# Patient Record
Sex: Male | Born: 1985
Health system: Southern US, Community
[De-identification: ages and names within clinical notes are randomized; demographics above are authoritative.]

---

## 2013-02-26 ENCOUNTER — Encounter (HOSPITAL_COMMUNITY): Payer: Self-pay | Admitting: Emergency Medicine

## 2013-02-26 ENCOUNTER — Emergency Department (INDEPENDENT_AMBULATORY_CARE_PROVIDER_SITE_OTHER)
Admission: EM | Admit: 2013-02-26 | Discharge: 2013-02-26 | Disposition: A | Discharge status: Home / Self Care | Payer: BC Managed Care – PPO | Source: Home / Self Care | Attending: Family Medicine | Admitting: Family Medicine

## 2013-02-26 DIAGNOSIS — S139XXA Sprain of joints and ligaments of unspecified parts of neck, initial encounter: Secondary | ICD-10-CM

## 2013-02-26 DIAGNOSIS — S134XXA Sprain of ligaments of cervical spine, initial encounter: Secondary | ICD-10-CM

## 2013-02-26 MED ORDER — CYCLOBENZAPRINE HCL 10 MG PO TABS: 10.0000 mg | ORAL_TABLET | Freq: Two times a day (BID) | ORAL | Status: AC | PRN

## 2013-02-26 NOTE — ED Provider Notes (Signed)
Medical screening examination/treatment/procedure(s) were performed by resident physician or non-physician practitioner and as supervising physician I was immediately available for consultation/collaboration.   Barkley Bruns MD.   Linna Hoff, MD 02/26/13 [phone]
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
## 2016-01-03 DIAGNOSIS — M542 Cervicalgia: Secondary | ICD-10-CM | POA: Diagnosis not present

## 2016-01-11 DIAGNOSIS — M542 Cervicalgia: Secondary | ICD-10-CM | POA: Diagnosis not present

## 2016-01-22 DIAGNOSIS — M542 Cervicalgia: Secondary | ICD-10-CM | POA: Diagnosis not present

## 2016-01-22 DIAGNOSIS — M50022 Cervical disc disorder at C5-C6 level with myelopathy: Secondary | ICD-10-CM | POA: Diagnosis not present

## 2016-01-22 DIAGNOSIS — M50322 Other cervical disc degeneration at C5-C6 level: Secondary | ICD-10-CM | POA: Diagnosis not present

## 2016-01-30 DIAGNOSIS — M50022 Cervical disc disorder at C5-C6 level with myelopathy: Secondary | ICD-10-CM | POA: Diagnosis not present

## 2016-02-06 DIAGNOSIS — M50022 Cervical disc disorder at C5-C6 level with myelopathy: Secondary | ICD-10-CM | POA: Diagnosis not present

## 2016-02-08 DIAGNOSIS — M50022 Cervical disc disorder at C5-C6 level with myelopathy: Secondary | ICD-10-CM | POA: Diagnosis not present

## 2016-02-13 DIAGNOSIS — M50022 Cervical disc disorder at C5-C6 level with myelopathy: Secondary | ICD-10-CM | POA: Diagnosis not present

## 2016-02-27 DIAGNOSIS — M50322 Other cervical disc degeneration at C5-C6 level: Secondary | ICD-10-CM | POA: Diagnosis not present

## 2016-02-27 DIAGNOSIS — M50022 Cervical disc disorder at C5-C6 level with myelopathy: Secondary | ICD-10-CM | POA: Diagnosis not present

## 2016-03-24 DIAGNOSIS — R05 Cough: Secondary | ICD-10-CM | POA: Diagnosis not present

## 2016-04-11 DIAGNOSIS — M542 Cervicalgia: Secondary | ICD-10-CM | POA: Diagnosis not present

## 2016-04-29 DIAGNOSIS — J069 Acute upper respiratory infection, unspecified: Secondary | ICD-10-CM | POA: Diagnosis not present

## 2016-04-29 DIAGNOSIS — R05 Cough: Secondary | ICD-10-CM | POA: Diagnosis not present

## 2016-05-09 DIAGNOSIS — M542 Cervicalgia: Secondary | ICD-10-CM | POA: Diagnosis not present

## 2016-06-09 DIAGNOSIS — M542 Cervicalgia: Secondary | ICD-10-CM | POA: Diagnosis not present

## 2016-07-09 DIAGNOSIS — M542 Cervicalgia: Secondary | ICD-10-CM | POA: Diagnosis not present

## 2016-08-09 DIAGNOSIS — M542 Cervicalgia: Secondary | ICD-10-CM | POA: Diagnosis not present

## 2016-09-12 DIAGNOSIS — Z131 Encounter for screening for diabetes mellitus: Secondary | ICD-10-CM | POA: Diagnosis not present

## 2016-09-12 DIAGNOSIS — Z13220 Encounter for screening for lipoid disorders: Secondary | ICD-10-CM | POA: Diagnosis not present

## 2016-09-12 DIAGNOSIS — Z8379 Family history of other diseases of the digestive system: Secondary | ICD-10-CM | POA: Diagnosis not present

## 2016-09-12 DIAGNOSIS — Z8349 Family history of other endocrine, nutritional and metabolic diseases: Secondary | ICD-10-CM | POA: Diagnosis not present

## 2016-09-12 DIAGNOSIS — Z0000 Encounter for general adult medical examination without abnormal findings: Secondary | ICD-10-CM | POA: Diagnosis not present

## 2016-10-29 DIAGNOSIS — R7989 Other specified abnormal findings of blood chemistry: Secondary | ICD-10-CM | POA: Diagnosis not present

## 2016-10-29 DIAGNOSIS — R946 Abnormal results of thyroid function studies: Secondary | ICD-10-CM | POA: Diagnosis not present

## 2016-12-24 DIAGNOSIS — B349 Viral infection, unspecified: Secondary | ICD-10-CM | POA: Diagnosis not present

## 2016-12-24 DIAGNOSIS — L237 Allergic contact dermatitis due to plants, except food: Secondary | ICD-10-CM | POA: Diagnosis not present

## 2016-12-24 DIAGNOSIS — E039 Hypothyroidism, unspecified: Secondary | ICD-10-CM | POA: Diagnosis not present

## 2017-04-08 DIAGNOSIS — B349 Viral infection, unspecified: Secondary | ICD-10-CM | POA: Diagnosis not present

## 2017-04-08 DIAGNOSIS — R509 Fever, unspecified: Secondary | ICD-10-CM | POA: Diagnosis not present

## 2017-04-08 DIAGNOSIS — A084 Viral intestinal infection, unspecified: Secondary | ICD-10-CM | POA: Diagnosis not present

## 2017-05-28 DIAGNOSIS — J209 Acute bronchitis, unspecified: Secondary | ICD-10-CM | POA: Diagnosis not present

## 2017-07-09 DIAGNOSIS — F4323 Adjustment disorder with mixed anxiety and depressed mood: Secondary | ICD-10-CM | POA: Diagnosis not present

## 2017-07-16 DIAGNOSIS — F4323 Adjustment disorder with mixed anxiety and depressed mood: Secondary | ICD-10-CM | POA: Diagnosis not present

## 2018-04-21 DIAGNOSIS — R509 Fever, unspecified: Secondary | ICD-10-CM | POA: Diagnosis not present

## 2018-04-21 DIAGNOSIS — R69 Illness, unspecified: Secondary | ICD-10-CM | POA: Diagnosis not present

## 2018-04-21 DIAGNOSIS — R52 Pain, unspecified: Secondary | ICD-10-CM | POA: Diagnosis not present

## 2018-10-07 DIAGNOSIS — Z20828 Contact with and (suspected) exposure to other viral communicable diseases: Secondary | ICD-10-CM | POA: Diagnosis not present

## 2019-04-05 DIAGNOSIS — R0789 Other chest pain: Secondary | ICD-10-CM | POA: Diagnosis not present

## 2019-04-19 DIAGNOSIS — R0789 Other chest pain: Secondary | ICD-10-CM | POA: Diagnosis not present

## 2019-04-26 DIAGNOSIS — R0789 Other chest pain: Secondary | ICD-10-CM | POA: Diagnosis not present

## 2019-08-23 DIAGNOSIS — R519 Headache, unspecified: Secondary | ICD-10-CM | POA: Diagnosis not present

## 2019-08-23 DIAGNOSIS — J309 Allergic rhinitis, unspecified: Secondary | ICD-10-CM | POA: Diagnosis not present

## 2019-08-23 DIAGNOSIS — R0981 Nasal congestion: Secondary | ICD-10-CM | POA: Diagnosis not present

## 2019-08-25 ENCOUNTER — Other Ambulatory Visit: Payer: Self-pay | Admitting: Unknown Physician Specialty

## 2019-08-25 ENCOUNTER — Other Ambulatory Visit (HOSPITAL_COMMUNITY): Payer: Self-pay | Admitting: Unknown Physician Specialty

## 2019-08-25 DIAGNOSIS — R519 Headache, unspecified: Secondary | ICD-10-CM

## 2019-08-27 DIAGNOSIS — R519 Headache, unspecified: Secondary | ICD-10-CM | POA: Diagnosis not present

## 2019-08-27 DIAGNOSIS — M542 Cervicalgia: Secondary | ICD-10-CM | POA: Diagnosis not present

## 2019-08-27 DIAGNOSIS — M9901 Segmental and somatic dysfunction of cervical region: Secondary | ICD-10-CM | POA: Diagnosis not present

## 2019-08-27 DIAGNOSIS — M5412 Radiculopathy, cervical region: Secondary | ICD-10-CM | POA: Diagnosis not present

## 2019-08-30 DIAGNOSIS — M9901 Segmental and somatic dysfunction of cervical region: Secondary | ICD-10-CM | POA: Diagnosis not present

## 2019-08-30 DIAGNOSIS — M5412 Radiculopathy, cervical region: Secondary | ICD-10-CM | POA: Diagnosis not present

## 2019-08-30 DIAGNOSIS — R519 Headache, unspecified: Secondary | ICD-10-CM | POA: Diagnosis not present

## 2019-08-30 DIAGNOSIS — M542 Cervicalgia: Secondary | ICD-10-CM | POA: Diagnosis not present

## 2019-09-03 DIAGNOSIS — R519 Headache, unspecified: Secondary | ICD-10-CM | POA: Diagnosis not present

## 2019-09-03 DIAGNOSIS — M9901 Segmental and somatic dysfunction of cervical region: Secondary | ICD-10-CM | POA: Diagnosis not present

## 2019-09-03 DIAGNOSIS — M5412 Radiculopathy, cervical region: Secondary | ICD-10-CM | POA: Diagnosis not present

## 2019-09-03 DIAGNOSIS — M542 Cervicalgia: Secondary | ICD-10-CM | POA: Diagnosis not present

## 2019-09-07 ENCOUNTER — Ambulatory Visit
Admission: RE | Admit: 2019-09-07 | Discharge: 2019-09-07 | Disposition: A | Discharge status: Home / Self Care | Payer: BLUE CROSS/BLUE SHIELD | Source: Ambulatory Visit | Attending: Unknown Physician Specialty | Admitting: Unknown Physician Specialty

## 2019-09-07 ENCOUNTER — Other Ambulatory Visit: Payer: Self-pay

## 2019-09-07 DIAGNOSIS — M9901 Segmental and somatic dysfunction of cervical region: Secondary | ICD-10-CM | POA: Diagnosis not present

## 2019-09-07 DIAGNOSIS — R519 Headache, unspecified: Secondary | ICD-10-CM | POA: Insufficient documentation

## 2019-09-07 DIAGNOSIS — M542 Cervicalgia: Secondary | ICD-10-CM | POA: Diagnosis not present

## 2019-09-07 DIAGNOSIS — M5412 Radiculopathy, cervical region: Secondary | ICD-10-CM | POA: Diagnosis not present

## 2019-09-07 DIAGNOSIS — J3489 Other specified disorders of nose and nasal sinuses: Secondary | ICD-10-CM | POA: Diagnosis not present

## 2019-09-07 DIAGNOSIS — J341 Cyst and mucocele of nose and nasal sinus: Secondary | ICD-10-CM | POA: Diagnosis not present

## 2019-09-07 MED ORDER — GADOBUTROL 1 MMOL/ML IV SOLN
9.0000 mL | Freq: Once | INTRAVENOUS | Status: AC | PRN
  Administered 2019-09-07: 11:00:00 9 mL via INTRAVENOUS

## 2019-09-10 DIAGNOSIS — M5412 Radiculopathy, cervical region: Secondary | ICD-10-CM | POA: Diagnosis not present

## 2019-09-10 DIAGNOSIS — R519 Headache, unspecified: Secondary | ICD-10-CM | POA: Diagnosis not present

## 2019-09-10 DIAGNOSIS — M9901 Segmental and somatic dysfunction of cervical region: Secondary | ICD-10-CM | POA: Diagnosis not present

## 2019-09-10 DIAGNOSIS — M542 Cervicalgia: Secondary | ICD-10-CM | POA: Diagnosis not present

## 2019-09-14 DIAGNOSIS — M9901 Segmental and somatic dysfunction of cervical region: Secondary | ICD-10-CM | POA: Diagnosis not present

## 2019-09-14 DIAGNOSIS — M5412 Radiculopathy, cervical region: Secondary | ICD-10-CM | POA: Diagnosis not present

## 2019-09-14 DIAGNOSIS — R519 Headache, unspecified: Secondary | ICD-10-CM | POA: Diagnosis not present

## 2019-09-14 DIAGNOSIS — M542 Cervicalgia: Secondary | ICD-10-CM | POA: Diagnosis not present

## 2019-09-17 DIAGNOSIS — M542 Cervicalgia: Secondary | ICD-10-CM | POA: Diagnosis not present

## 2019-09-17 DIAGNOSIS — R519 Headache, unspecified: Secondary | ICD-10-CM | POA: Diagnosis not present

## 2019-09-17 DIAGNOSIS — M9901 Segmental and somatic dysfunction of cervical region: Secondary | ICD-10-CM | POA: Diagnosis not present

## 2019-09-17 DIAGNOSIS — M5412 Radiculopathy, cervical region: Secondary | ICD-10-CM | POA: Diagnosis not present

## 2019-09-28 DIAGNOSIS — M5412 Radiculopathy, cervical region: Secondary | ICD-10-CM | POA: Diagnosis not present

## 2019-09-28 DIAGNOSIS — M542 Cervicalgia: Secondary | ICD-10-CM | POA: Diagnosis not present

## 2019-09-28 DIAGNOSIS — J301 Allergic rhinitis due to pollen: Secondary | ICD-10-CM | POA: Diagnosis not present

## 2019-09-28 DIAGNOSIS — R519 Headache, unspecified: Secondary | ICD-10-CM | POA: Diagnosis not present

## 2019-09-28 DIAGNOSIS — M9901 Segmental and somatic dysfunction of cervical region: Secondary | ICD-10-CM | POA: Diagnosis not present

## 2019-10-01 DIAGNOSIS — R519 Headache, unspecified: Secondary | ICD-10-CM | POA: Diagnosis not present

## 2019-10-01 DIAGNOSIS — M5412 Radiculopathy, cervical region: Secondary | ICD-10-CM | POA: Diagnosis not present

## 2019-10-01 DIAGNOSIS — M9901 Segmental and somatic dysfunction of cervical region: Secondary | ICD-10-CM | POA: Diagnosis not present

## 2019-10-01 DIAGNOSIS — M542 Cervicalgia: Secondary | ICD-10-CM | POA: Diagnosis not present

## 2019-10-05 DIAGNOSIS — M542 Cervicalgia: Secondary | ICD-10-CM | POA: Diagnosis not present

## 2019-10-05 DIAGNOSIS — M5412 Radiculopathy, cervical region: Secondary | ICD-10-CM | POA: Diagnosis not present

## 2019-10-05 DIAGNOSIS — M9901 Segmental and somatic dysfunction of cervical region: Secondary | ICD-10-CM | POA: Diagnosis not present

## 2019-10-05 DIAGNOSIS — R519 Headache, unspecified: Secondary | ICD-10-CM | POA: Diagnosis not present

## 2019-10-06 DIAGNOSIS — J301 Allergic rhinitis due to pollen: Secondary | ICD-10-CM | POA: Diagnosis not present

## 2019-10-08 DIAGNOSIS — M5412 Radiculopathy, cervical region: Secondary | ICD-10-CM | POA: Diagnosis not present

## 2019-10-08 DIAGNOSIS — M542 Cervicalgia: Secondary | ICD-10-CM | POA: Diagnosis not present

## 2019-10-08 DIAGNOSIS — R519 Headache, unspecified: Secondary | ICD-10-CM | POA: Diagnosis not present

## 2019-10-08 DIAGNOSIS — M9901 Segmental and somatic dysfunction of cervical region: Secondary | ICD-10-CM | POA: Diagnosis not present

## 2019-10-11 DIAGNOSIS — J301 Allergic rhinitis due to pollen: Secondary | ICD-10-CM | POA: Diagnosis not present

## 2019-10-12 DIAGNOSIS — M542 Cervicalgia: Secondary | ICD-10-CM | POA: Diagnosis not present

## 2019-10-12 DIAGNOSIS — M9901 Segmental and somatic dysfunction of cervical region: Secondary | ICD-10-CM | POA: Diagnosis not present

## 2019-10-12 DIAGNOSIS — M5412 Radiculopathy, cervical region: Secondary | ICD-10-CM | POA: Diagnosis not present

## 2019-10-12 DIAGNOSIS — R519 Headache, unspecified: Secondary | ICD-10-CM | POA: Diagnosis not present

## 2019-10-14 DIAGNOSIS — J301 Allergic rhinitis due to pollen: Secondary | ICD-10-CM | POA: Diagnosis not present

## 2019-10-18 DIAGNOSIS — J301 Allergic rhinitis due to pollen: Secondary | ICD-10-CM | POA: Diagnosis not present

## 2019-10-19 DIAGNOSIS — M542 Cervicalgia: Secondary | ICD-10-CM | POA: Diagnosis not present

## 2019-10-19 DIAGNOSIS — R519 Headache, unspecified: Secondary | ICD-10-CM | POA: Diagnosis not present

## 2019-10-19 DIAGNOSIS — J301 Allergic rhinitis due to pollen: Secondary | ICD-10-CM | POA: Diagnosis not present

## 2019-10-19 DIAGNOSIS — M5412 Radiculopathy, cervical region: Secondary | ICD-10-CM | POA: Diagnosis not present

## 2019-10-19 DIAGNOSIS — M9901 Segmental and somatic dysfunction of cervical region: Secondary | ICD-10-CM | POA: Diagnosis not present

## 2019-10-20 DIAGNOSIS — J309 Allergic rhinitis, unspecified: Secondary | ICD-10-CM | POA: Diagnosis not present

## 2019-10-21 DIAGNOSIS — J301 Allergic rhinitis due to pollen: Secondary | ICD-10-CM | POA: Diagnosis not present

## 2019-10-22 DIAGNOSIS — M5412 Radiculopathy, cervical region: Secondary | ICD-10-CM | POA: Diagnosis not present

## 2019-10-22 DIAGNOSIS — M542 Cervicalgia: Secondary | ICD-10-CM | POA: Diagnosis not present

## 2019-10-22 DIAGNOSIS — M9901 Segmental and somatic dysfunction of cervical region: Secondary | ICD-10-CM | POA: Diagnosis not present

## 2019-10-22 DIAGNOSIS — R519 Headache, unspecified: Secondary | ICD-10-CM | POA: Diagnosis not present

## 2019-10-25 DIAGNOSIS — J301 Allergic rhinitis due to pollen: Secondary | ICD-10-CM | POA: Diagnosis not present

## 2019-10-26 DIAGNOSIS — M542 Cervicalgia: Secondary | ICD-10-CM | POA: Diagnosis not present

## 2019-10-26 DIAGNOSIS — M9901 Segmental and somatic dysfunction of cervical region: Secondary | ICD-10-CM | POA: Diagnosis not present

## 2019-10-26 DIAGNOSIS — R519 Headache, unspecified: Secondary | ICD-10-CM | POA: Diagnosis not present

## 2019-10-26 DIAGNOSIS — M5412 Radiculopathy, cervical region: Secondary | ICD-10-CM | POA: Diagnosis not present

## 2019-10-28 DIAGNOSIS — J301 Allergic rhinitis due to pollen: Secondary | ICD-10-CM | POA: Diagnosis not present

## 2019-10-29 DIAGNOSIS — M5412 Radiculopathy, cervical region: Secondary | ICD-10-CM | POA: Diagnosis not present

## 2019-10-29 DIAGNOSIS — M9901 Segmental and somatic dysfunction of cervical region: Secondary | ICD-10-CM | POA: Diagnosis not present

## 2019-10-29 DIAGNOSIS — M542 Cervicalgia: Secondary | ICD-10-CM | POA: Diagnosis not present

## 2019-10-29 DIAGNOSIS — R519 Headache, unspecified: Secondary | ICD-10-CM | POA: Diagnosis not present

## 2019-11-01 DIAGNOSIS — J301 Allergic rhinitis due to pollen: Secondary | ICD-10-CM | POA: Diagnosis not present

## 2019-11-02 DIAGNOSIS — M9901 Segmental and somatic dysfunction of cervical region: Secondary | ICD-10-CM | POA: Diagnosis not present

## 2019-11-02 DIAGNOSIS — M542 Cervicalgia: Secondary | ICD-10-CM | POA: Diagnosis not present

## 2019-11-02 DIAGNOSIS — R519 Headache, unspecified: Secondary | ICD-10-CM | POA: Diagnosis not present

## 2019-11-02 DIAGNOSIS — M5412 Radiculopathy, cervical region: Secondary | ICD-10-CM | POA: Diagnosis not present

## 2019-11-04 DIAGNOSIS — J301 Allergic rhinitis due to pollen: Secondary | ICD-10-CM | POA: Diagnosis not present

## 2019-11-08 DIAGNOSIS — J301 Allergic rhinitis due to pollen: Secondary | ICD-10-CM | POA: Diagnosis not present

## 2019-11-11 DIAGNOSIS — J301 Allergic rhinitis due to pollen: Secondary | ICD-10-CM | POA: Diagnosis not present

## 2019-11-12 DIAGNOSIS — J301 Allergic rhinitis due to pollen: Secondary | ICD-10-CM | POA: Diagnosis not present

## 2019-11-18 DIAGNOSIS — J301 Allergic rhinitis due to pollen: Secondary | ICD-10-CM | POA: Diagnosis not present

## 2019-11-25 DIAGNOSIS — J301 Allergic rhinitis due to pollen: Secondary | ICD-10-CM | POA: Diagnosis not present

## 2019-12-02 DIAGNOSIS — J301 Allergic rhinitis due to pollen: Secondary | ICD-10-CM | POA: Diagnosis not present

## 2019-12-06 DIAGNOSIS — J301 Allergic rhinitis due to pollen: Secondary | ICD-10-CM | POA: Diagnosis not present

## 2019-12-09 DIAGNOSIS — J301 Allergic rhinitis due to pollen: Secondary | ICD-10-CM | POA: Diagnosis not present

## 2019-12-13 DIAGNOSIS — J301 Allergic rhinitis due to pollen: Secondary | ICD-10-CM | POA: Diagnosis not present

## 2019-12-21 DIAGNOSIS — J301 Allergic rhinitis due to pollen: Secondary | ICD-10-CM | POA: Diagnosis not present

## 2020-01-21 DIAGNOSIS — J301 Allergic rhinitis due to pollen: Secondary | ICD-10-CM | POA: Diagnosis not present

## 2020-01-24 DIAGNOSIS — J301 Allergic rhinitis due to pollen: Secondary | ICD-10-CM | POA: Diagnosis not present

## 2020-04-19 DIAGNOSIS — J309 Allergic rhinitis, unspecified: Secondary | ICD-10-CM | POA: Diagnosis not present

## 2020-04-19 DIAGNOSIS — R0982 Postnasal drip: Secondary | ICD-10-CM | POA: Diagnosis not present

## 2020-04-19 DIAGNOSIS — J3489 Other specified disorders of nose and nasal sinuses: Secondary | ICD-10-CM | POA: Diagnosis not present

## 2020-04-26 DIAGNOSIS — R0789 Other chest pain: Secondary | ICD-10-CM | POA: Diagnosis not present

## 2020-04-26 DIAGNOSIS — Z23 Encounter for immunization: Secondary | ICD-10-CM | POA: Diagnosis not present

## 2020-05-19 DIAGNOSIS — J301 Allergic rhinitis due to pollen: Secondary | ICD-10-CM | POA: Diagnosis not present

## 2020-05-22 DIAGNOSIS — J301 Allergic rhinitis due to pollen: Secondary | ICD-10-CM | POA: Diagnosis not present

## 2020-07-22 IMAGING — MR MR HEAD WO/W CM
14 series · 48 of 48 positions shown · IV contrast (gadavist)
Comparison: No pertinent prior studies available for comparison.

CLINICAL DATA: Non-intractable headache, unspecified chronicity
pattern, unspecified headache type. Additional history provided by
technologist: Patient reports chronic headaches since the age of
[DATE], states increased severity of headaches since COVID vaccine
earlier this year.

EXAM:
MRI HEAD WITHOUT AND WITH CONTRAST
TECHNIQUE: Multiplanar, multiecho pulse sequences of the brain and surrounding
structures were obtained without and with intravenous contrast.
CONTRAST:  9mL GADAVIST GADOBUTROL 1 MMOL/ML IV SOLN

[Series 5: ax dwi_tracew · axial · 3.0mm · 0.60mm/px · z∈[-79,+82]mm · 2 of 50 slices shown]
[im 1/50]
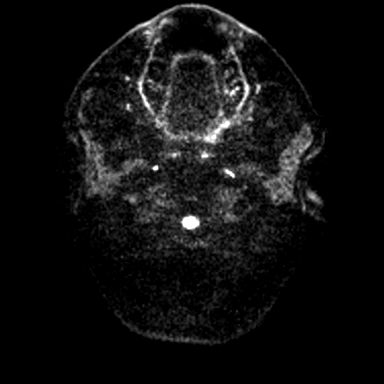
[im 50/50]
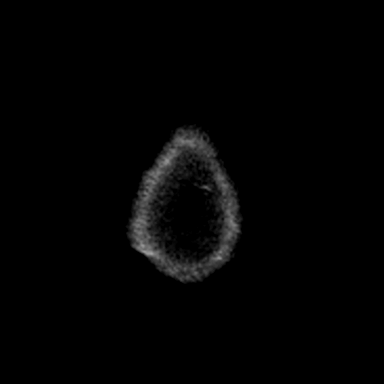

[Series 6: ax dwi_adc · axial · 3.0mm · 0.60mm/px · z∈[-79,+82]mm · 3 of 50 slices shown]
[im 1/50]
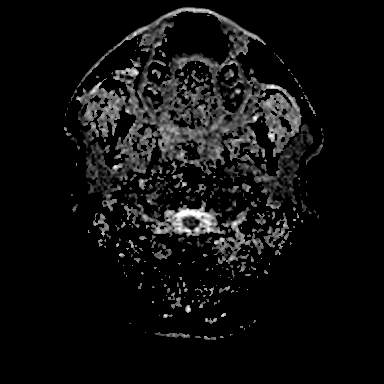
[im 25/50]
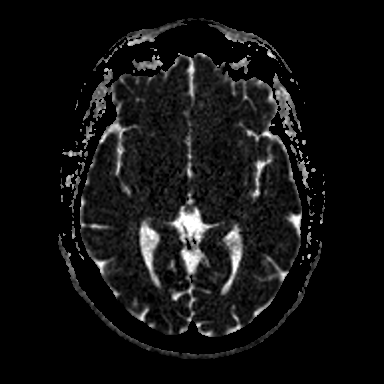
[im 50/50]
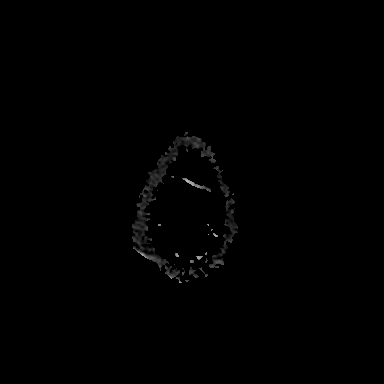

[Series 7: cor dwi_tracew · coronal · 5.0mm · 0.60mm/px · 2 of 38 slices shown]
[im 1/38]
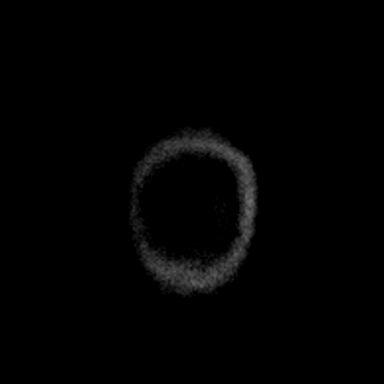
[im 38/38]
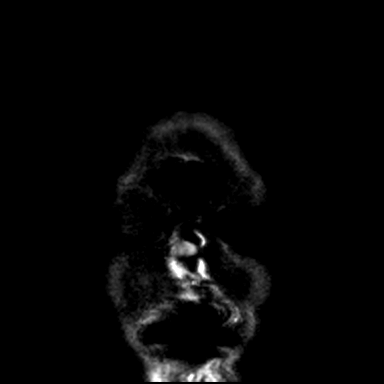

[Series 8: cor dwi_adc · coronal · 5.0mm · 0.60mm/px · 2 of 38 slices shown]
[im 1/38]
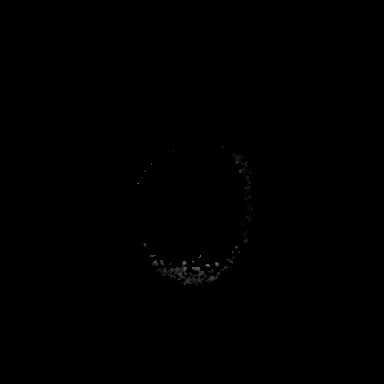
[im 38/38]
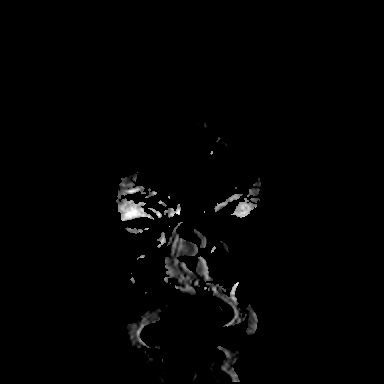

[Series 9: T1 · sagittal · 5.0mm · 0.62mm/px · 1 of 25 slices shown (1 of 2)]
[im 1/25]
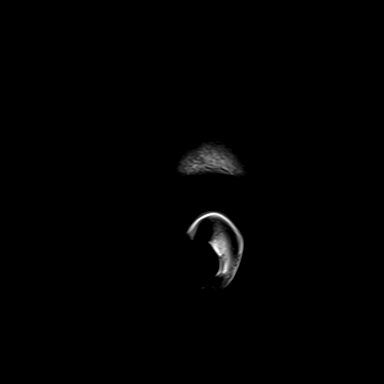

[Series 10: T2 · axial · 5.0mm · 0.53mm/px · z∈[-82,+86]mm · 2 of 29 slices shown]
[im 1/29]
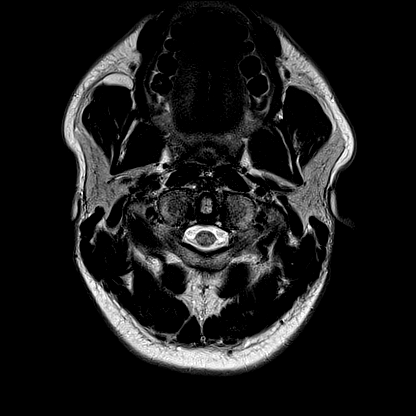
[im 29/29]
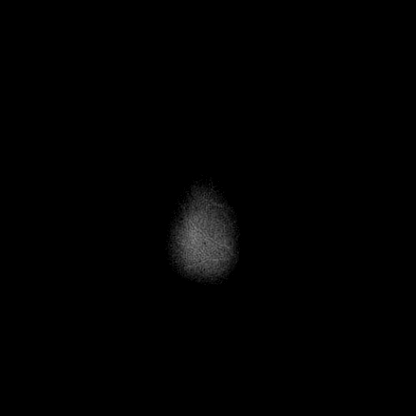

[Series 11: mag_images · axial · 3.0mm · 0.90mm/px · z∈[-86,+91]mm · 3 of 60 slices shown]
[im 1/60]
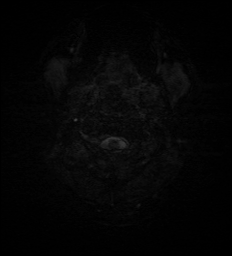
[im 30/60]
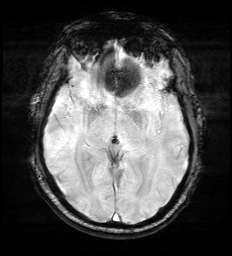
[im 60/60]
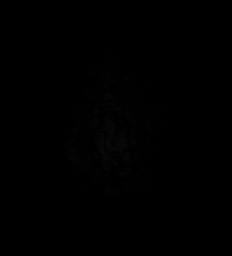

[Series 12: pha_images · axial · 3.0mm · 0.90mm/px · z∈[-86,+91]mm · 3 of 60 slices shown]
[im 1/60]
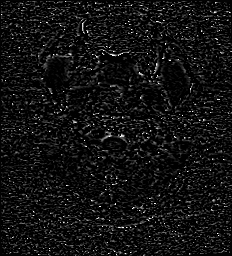
[im 30/60]
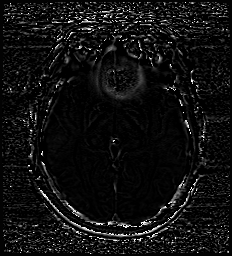
[im 60/60]
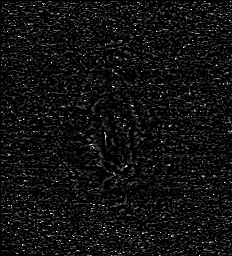

[Series 13: swi_images · axial · 3.0mm · 0.90mm/px · z∈[-86,+91]mm · 3 of 60 slices shown]
[im 1/60]
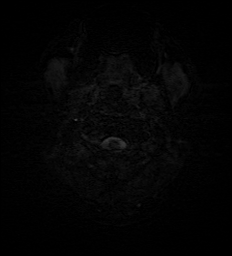
[im 30/60]
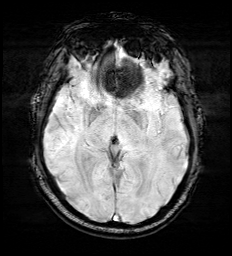
[im 60/60]
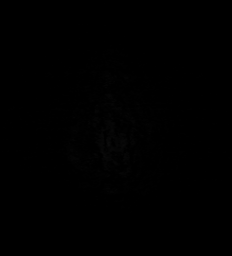

[Series 15: FLAIR · axial · 3.0mm · 0.53mm/px · z∈[-79,+83]mm · 3 of 55 slices shown]
[im 1/55]
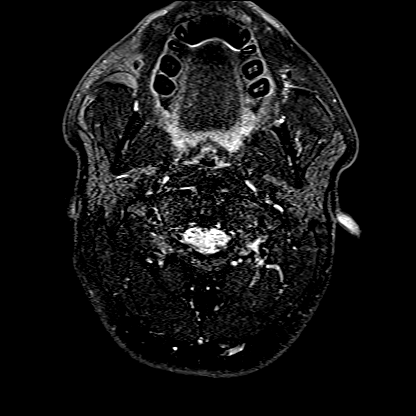
[im 28/55]
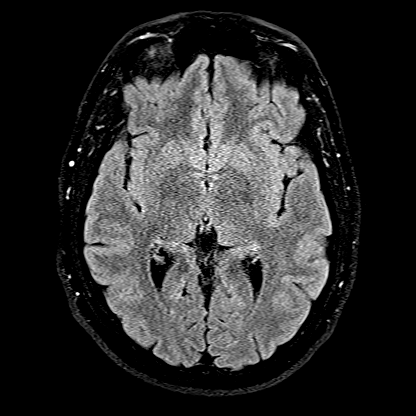
[im 55/55]
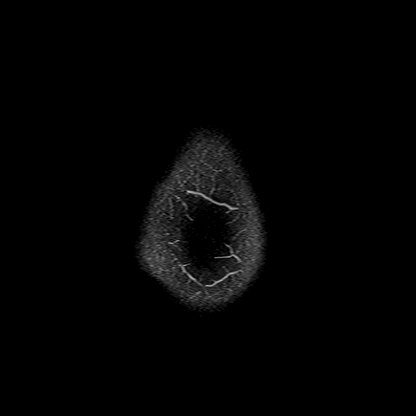

[Series 16: T1 · axial · 1.0mm · 0.98mm/px · z∈[-84,+91]mm · 10 of 176 slices shown (2 of 2)]
[im 1/176]
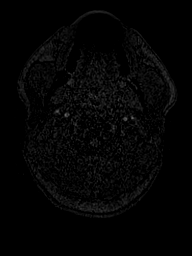
[im 20/176]
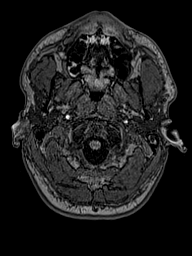
[im 39/176]
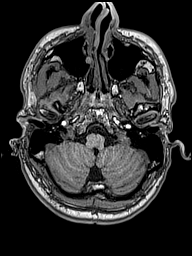
[im 59/176]
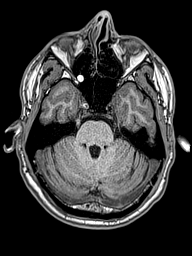
[im 78/176]
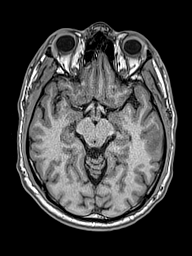
[im 98/176]
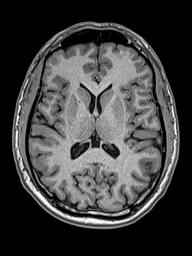
[im 117/176]
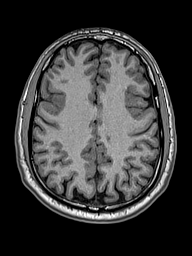
[im 137/176]
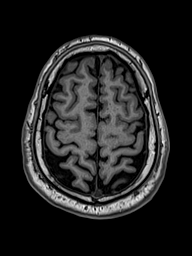
[im 156/176]
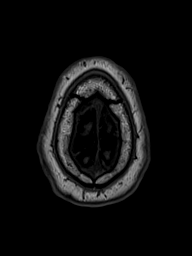
[im 176/176]
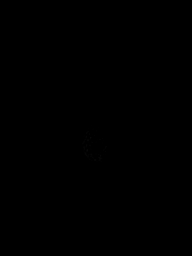

[Series 17: T2 post-contrast · coronal · 5.0mm · 0.57mm/px · 2 of 30 slices shown]
[im 1/30]
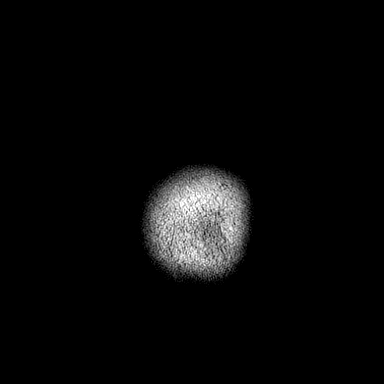
[im 30/30]
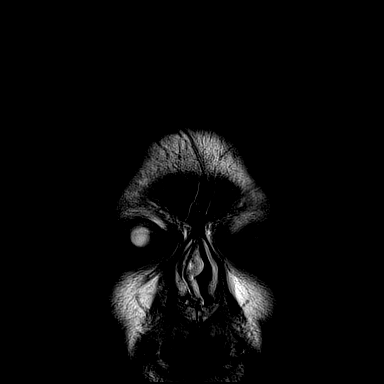

[Series 18: T1 post-contrast · axial · 1.0mm · 0.98mm/px · z∈[-84,+91]mm · 10 of 176 slices shown (1 of 2)]
[im 1/176]
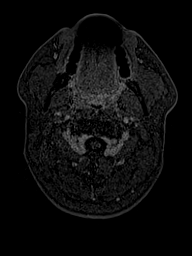
[im 20/176]
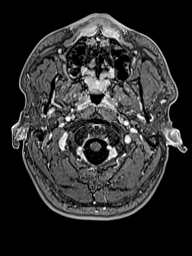
[im 39/176]
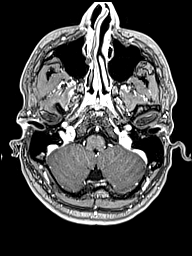
[im 59/176]
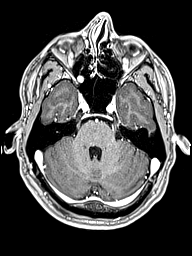
[im 78/176]
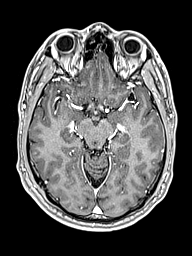
[im 98/176]
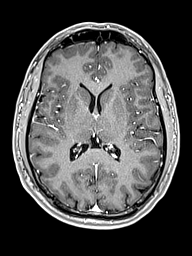
[im 117/176]
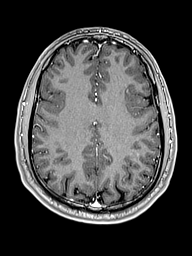
[im 137/176]
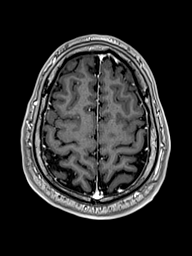
[im 156/176]
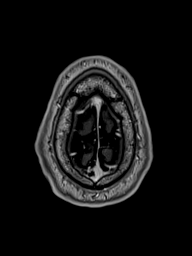
[im 176/176]
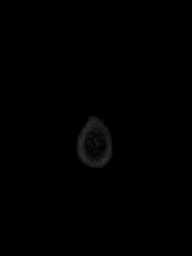

[Series 19: T1 post-contrast · coronal · 5.0mm · 0.57mm/px · 2 of 30 slices shown (2 of 2)]
[im 1/30]
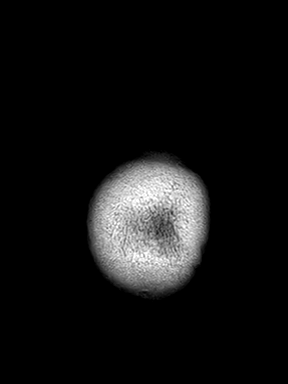
[im 30/30]
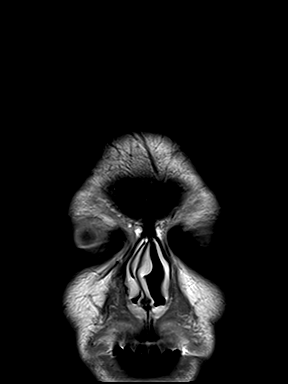

[48 of 48 positions shown; findings below may reference images not displayed]

FINDINGS: Brain:

Cerebral volume is normal.

No focal parenchymal signal or abnormal intracranial enhancement is
identified.

There is no acute infarct.

No evidence of intracranial mass.

No chronic intracranial blood products.

No extra-axial fluid collection.

No midline shift.

Vascular: Expected proximal arterial flow voids.

Skull and upper cervical spine: No focal marrow lesion.

Sinuses/Orbits: Visualized orbits show no acute finding. Small
mucous retention cysts within posterior right ethmoid air cells and
within the maxillary sinuses. No significant mastoid effusion.
IMPRESSION: Unremarkable MRI appearance of the brain. No evidence of acute
intracranial abnormality.

Small right ethmoid and bilateral maxillary sinus mucous retention
cysts.

## 2020-08-12 DIAGNOSIS — R519 Headache, unspecified: Secondary | ICD-10-CM | POA: Diagnosis not present

## 2020-08-12 DIAGNOSIS — U099 Post covid-19 condition, unspecified: Secondary | ICD-10-CM | POA: Diagnosis not present

## 2020-08-12 DIAGNOSIS — G8929 Other chronic pain: Secondary | ICD-10-CM | POA: Diagnosis not present

## 2020-08-21 DIAGNOSIS — Z3009 Encounter for other general counseling and advice on contraception: Secondary | ICD-10-CM | POA: Diagnosis not present

## 2020-08-23 ENCOUNTER — Ambulatory Visit: Payer: BLUE CROSS/BLUE SHIELD | Admitting: Family Medicine

## 2020-08-23 ENCOUNTER — Other Ambulatory Visit: Payer: Self-pay

## 2020-08-23 ENCOUNTER — Other Ambulatory Visit: Payer: Self-pay | Admitting: Family Medicine

## 2020-08-23 ENCOUNTER — Encounter: Payer: Self-pay | Admitting: Family Medicine

## 2020-08-23 VITALS — BP 112/77 | HR 82 | Ht 74.0 in | Wt 189.4 lb

## 2020-08-23 DIAGNOSIS — F411 Generalized anxiety disorder: Secondary | ICD-10-CM

## 2020-08-23 DIAGNOSIS — F4321 Adjustment disorder with depressed mood: Secondary | ICD-10-CM | POA: Diagnosis not present

## 2020-08-23 DIAGNOSIS — Z7689 Persons encountering health services in other specified circumstances: Secondary | ICD-10-CM

## 2020-08-23 DIAGNOSIS — Z0000 Encounter for general adult medical examination without abnormal findings: Secondary | ICD-10-CM

## 2020-08-23 DIAGNOSIS — E7800 Pure hypercholesterolemia, unspecified: Secondary | ICD-10-CM

## 2020-08-23 MED ORDER — ESCITALOPRAM OXALATE 10 MG PO TABS: 10.0000 mg | ORAL_TABLET | Freq: Every day | ORAL | 2 refills | Status: AC

## 2020-08-23 NOTE — Progress Notes (Signed)
Subjective:    Patient ID: Troy Branch, male    DOB: 1985-10-29, 35 y.o.   MRN: [id number]  Troy Branch is a 35 y.o. male presenting on 08/23/2020 for Establish Care  Here to establish as new patient. No regular PCP recently.  HPI  Generalized Anxiety Disorder Associated with depressed mood currently - no chronic depression previously  No prior history of depression or anxiety in the past that has been diagnosed. Currently stressors seem to be primary triggers. This is a new problem. Reports multiple life stressors in the past >6 months with several issues related to work (now working from home) and relationships family issues with some resentment from childhood. Multiple stressors, including financial stressors. - He admits multiple stressors build up and now becoming more overwhelming. Impacting his function Additional history of low thyroid in past, on blood work, he took medication temporarily and has been off of it. He has family history of thyroid problem. No prior medications interested in mental health anti depressant therapy and referral for therapist   Depression screen [facility] 2/9 08/23/2020  Decreased Interest 2  Down, Depressed, Hopeless 2  PHQ - 2 Score 4  Altered sleeping 3  Tired, decreased energy 3  Change in appetite 1  Feeling bad or failure about yourself  3  Trouble concentrating 1  Moving slowly or fidgety/restless 0  Suicidal thoughts 2  PHQ-9 Score 17  Difficult doing work/chores Extremely dIfficult   GAD 7 : Generalized Anxiety Score 08/23/2020  Nervous, Anxious, on Edge 2  Control/stop worrying 3  Worry too much - different things 3  Trouble relaxing 3  Restless 2  Easily annoyed or irritable 2  Afraid - awful might happen 1  Total GAD 7 Score 16  Anxiety Difficulty Somewhat difficult   [place]-Suicide Severity Rating Scale 1) Have you wished you were dead or wished you could go to sleep and not wake up? - Yes  2) Have you had any actual  thoughts of killing yourself? - No  Skip questions 3,4, 5  6) Have you ever done anything, started to do anything, or prepared to do anything to end your life? - No   History reviewed. No pertinent past medical history. History reviewed. No pertinent surgical history. Social History   Socioeconomic History   Marital status: Single    Spouse name: Not on file   Number of children: Not on file   Years of education: Not on file   Highest education level: Not on file  Occupational History   Not on file  Tobacco Use   Smoking status: Never   Smokeless tobacco: Never  Substance and Sexual Activity   Alcohol use: Yes   Drug use: No   Sexual activity: Yes    Birth control/protection: Condom  Other Topics Concern   Not on file  Social History Narrative   Not on file   Social Determinants of Health   Financial Resource Strain: Not on file  Food Insecurity: Not on file  Transportation Needs: Not on file  Physical Activity: Not on file  Stress: Not on file  Social Connections: Not on file  Intimate Partner Violence: Not on file   History reviewed. No pertinent family history. Current Outpatient Medications on File Prior to Visit  Medication Sig   albuterol (VENTOLIN HFA) 108 (90 Base) MCG/ACT inhaler Inhale into the lungs.   cetirizine (ZYRTEC) 10 MG tablet Take 10 mg by mouth daily.   cyclobenzaprine (FLEXERIL) 10 MG tablet  Take 1 tablet (10 mg total) by mouth 2 (two) times daily as needed for muscle spasms.   diazepam (VALIUM) 5 MG tablet Take 5 mg by mouth every 6 (six) hours as needed.   fluticasone (FLONASE) 50 MCG/ACT nasal spray Place into the nose.   No current facility-administered medications on file prior to visit.    Review of Systems Per HPI unless specifically indicated above      Objective:    BP 112/77   Pulse 82   Ht 6\' 2"  (1.88 m)   Wt 189 lb 6.4 oz (85.9 kg)   SpO2 99%   BMI 24.32 kg/m   Wt Readings from Last 3 Encounters:  08/23/20 189 lb  6.4 oz (85.9 kg)    Physical Exam Vitals and nursing note reviewed.  Constitutional:      General: He is not in acute distress.    Appearance: Normal appearance. He is well-developed. He is not diaphoretic.     Comments: Well-appearing, comfortable, cooperative  HENT:     Head: Normocephalic and atraumatic.  Eyes:     General:        Right eye: No discharge.        Left eye: No discharge.     Conjunctiva/sclera: Conjunctivae normal.  Cardiovascular:     Rate and Rhythm: Normal rate.  Pulmonary:     Effort: Pulmonary effort is normal.  Skin:    General: Skin is warm and dry.     Findings: No erythema or rash.  Neurological:     Mental Status: He is alert and oriented to person, place, and time.  Psychiatric:        Mood and Affect: Mood normal.        Behavior: Behavior normal.        Thought Content: Thought content normal.     Comments: Well groomed, good eye contact, normal speech and thoughts   No results found for this or any previous visit.    Assessment & Plan:   Problem List Items Addressed This Visit     GAD (generalized anxiety disorder) - Primary   Relevant Medications   diazepam (VALIUM) 5 MG tablet   escitalopram (LEXAPRO) 10 MG tablet   Other Relevant Orders   Ambulatory referral to Psychiatry   Adjustment disorder with depressed mood   Relevant Medications   escitalopram (LEXAPRO) 10 MG tablet   Other Relevant Orders   Ambulatory referral to Psychiatry   Other Visit Diagnoses     Encounter to establish care with new doctor           GAD Adjustment disorder depressed mood w/o history of recurrent depression  Clinically today discussed new diagnosis GAD with predominant anxiety symptoms related to multiple life stressors impacting him >6 months, however he has had some chronic anxiety over prior years  His mood symptoms seem to be directly related to the life stressors and triggers  Will pursue further eval and management with referral to  Psychiatry - [facility] locally - Initiate SSRI new start with Escitalopram 10mg  daily, counseling given  - No prior dx / Psych / counseling  Plan: 1. Discussion on new diagnosis anxiety, management, complications 2. Start Escitalopram 10mg  daily AM with food, counseling on potential side effects risks, reviewed possible GI intolerance, insomnia (although likely to improve this given anxiety likely source of insomnia), sexual dysfunction - anticipate 4-6 weeks for notable effect, may need titrate dose to 20 in future. May start with HALF tab  5 mg if prefer.    Orders Placed This Encounter  Procedures   Ambulatory referral to Psychiatry    Referral Priority:   Routine    Referral Type:   Psychiatric    Referral Reason:   Specialty Services Required    Requested Specialty:   Psychiatry    Number of Visits Requested:   1     Meds ordered this encounter  Medications   escitalopram (LEXAPRO) 10 MG tablet    Sig: Take 1 tablet (10 mg total) by mouth daily.    Dispense:  30 tablet    Refill:  2     Follow up plan: Return in about 4 weeks (around 09/20/2020) for 4-6 week fasting lab only then 1 week later Annual Physical.   Future labs ordered 09/25/20  Saralyn Pilar, DO [facility] 08/23/2020, 3:36 PM

## 2020-08-23 NOTE — Patient Instructions (Signed)
Thank you for coming to the office today.  PSYCHIATRY (AND THERAPY-COUSEN[place]ING)   [facility] Address: [address], [place], [place] [address] [email or web address] Phone: [phone]  [facility]) Address: [address], [place], [place] [address] Hours: 8:30AM-5PM Phone: [phone]  Caryn Section, MD [address] [facility] [address] [place], [place] [address] Phone: [phone]  [facility]) [address], [place], [id number] Phone: [phone] (Option 1) [email or web address]  [facility] [place] [address], [place], [place] [address] Phone: [phone]  [employer], available walk-in 9am-4pm M-F [address] [place], [place] [address] Hours: 9am - 4pm (M-F, walk in available) [phone]   ----------------------------------------------------------------- THERAPIST ONLY   [facility] [address] [place], [place] [address] D[place] Amber P: [phone]  Delight Ovens, LCSW [address] [place], [place] [address] Main Line: [phone]  [facility]   Address: [address] [place], [place], [place] [address] Hours: Open today  9AM-7PM Phone: [phone]  [facility] [address], [facility]  - [facility] Address: [address] [place], [place] [address] Phone: [phone]    Please schedule a Follow-up Appointment to: No follow-ups on file.  If you have any other questions or concerns, please feel free to call the office or send a message through MyChart. You may also schedule an earlier appointment if necessary.  Additionally, you may be receiving a survey about your experience at our office within a few days to 1 week by e-mail or mail. We value your feedback.  Saralyn Pilar, DO [facility], [place]

## 2020-08-24 DIAGNOSIS — Z302 Encounter for sterilization: Secondary | ICD-10-CM | POA: Diagnosis not present

## 2020-09-08 DIAGNOSIS — J301 Allergic rhinitis due to pollen: Secondary | ICD-10-CM | POA: Diagnosis not present

## 2020-09-13 DIAGNOSIS — J301 Allergic rhinitis due to pollen: Secondary | ICD-10-CM | POA: Diagnosis not present

## 2020-09-25 ENCOUNTER — Other Ambulatory Visit: Payer: BLUE CROSS/BLUE SHIELD

## 2020-09-25 DIAGNOSIS — Z0000 Encounter for general adult medical examination without abnormal findings: Secondary | ICD-10-CM

## 2020-09-25 DIAGNOSIS — F411 Generalized anxiety disorder: Secondary | ICD-10-CM

## 2020-09-25 DIAGNOSIS — E7800 Pure hypercholesterolemia, unspecified: Secondary | ICD-10-CM

## 2020-10-02 ENCOUNTER — Encounter: Payer: BLUE CROSS/BLUE SHIELD | Admitting: Family Medicine

## 2021-04-23 DIAGNOSIS — R0982 Postnasal drip: Secondary | ICD-10-CM | POA: Diagnosis not present

## 2021-04-23 DIAGNOSIS — J3489 Other specified disorders of nose and nasal sinuses: Secondary | ICD-10-CM | POA: Diagnosis not present

## 2021-04-23 DIAGNOSIS — J309 Allergic rhinitis, unspecified: Secondary | ICD-10-CM | POA: Diagnosis not present

## 2021-05-15 DIAGNOSIS — E039 Hypothyroidism, unspecified: Secondary | ICD-10-CM | POA: Diagnosis not present

## 2021-05-15 DIAGNOSIS — Z79899 Other long term (current) drug therapy: Secondary | ICD-10-CM | POA: Diagnosis not present

## 2021-05-15 DIAGNOSIS — R0789 Other chest pain: Secondary | ICD-10-CM | POA: Diagnosis not present

## 2021-05-15 DIAGNOSIS — I2510 Atherosclerotic heart disease of native coronary artery without angina pectoris: Secondary | ICD-10-CM | POA: Diagnosis not present

## 2021-05-15 DIAGNOSIS — Z23 Encounter for immunization: Secondary | ICD-10-CM | POA: Diagnosis not present

## 2021-07-13 DIAGNOSIS — J301 Allergic rhinitis due to pollen: Secondary | ICD-10-CM | POA: Diagnosis not present
